# Patient Record
Sex: Female | Born: 1935 | Race: White | Hispanic: No | Marital: Married | Smoking: Never smoker
Health system: Southern US, Community
[De-identification: ages and names within clinical notes are randomized; demographics above are authoritative.]

## PROBLEM LIST (undated history)

## (undated) DIAGNOSIS — F0390 Unspecified dementia without behavioral disturbance: Secondary | ICD-10-CM

---

## 2012-01-05 DIAGNOSIS — I10 Essential (primary) hypertension: Secondary | ICD-10-CM | POA: Insufficient documentation

## 2013-01-18 DIAGNOSIS — K219 Gastro-esophageal reflux disease without esophagitis: Secondary | ICD-10-CM | POA: Insufficient documentation

## 2014-08-15 DIAGNOSIS — Z8639 Personal history of other endocrine, nutritional and metabolic disease: Secondary | ICD-10-CM | POA: Insufficient documentation

## 2019-03-26 DIAGNOSIS — E7800 Pure hypercholesterolemia, unspecified: Secondary | ICD-10-CM | POA: Insufficient documentation

## 2019-03-26 DIAGNOSIS — H409 Unspecified glaucoma: Secondary | ICD-10-CM | POA: Insufficient documentation

## 2019-06-07 DIAGNOSIS — F09 Unspecified mental disorder due to known physiological condition: Secondary | ICD-10-CM | POA: Insufficient documentation

## 2020-09-20 ENCOUNTER — Encounter: Payer: Self-pay | Admitting: Emergency Medicine

## 2020-09-20 ENCOUNTER — Emergency Department
Admission: EM | Admit: 2020-09-20 | Discharge: 2020-09-20 | Disposition: A | Discharge status: Home / Self Care | Payer: Self-pay | Source: Home / Self Care

## 2020-09-20 ENCOUNTER — Other Ambulatory Visit: Payer: Self-pay

## 2020-09-20 ENCOUNTER — Emergency Department (INDEPENDENT_AMBULATORY_CARE_PROVIDER_SITE_OTHER): Payer: Medicare Other

## 2020-09-20 DIAGNOSIS — J441 Chronic obstructive pulmonary disease with (acute) exacerbation: Secondary | ICD-10-CM | POA: Diagnosis not present

## 2020-09-20 DIAGNOSIS — J449 Chronic obstructive pulmonary disease, unspecified: Secondary | ICD-10-CM | POA: Diagnosis not present

## 2020-09-20 DIAGNOSIS — J069 Acute upper respiratory infection, unspecified: Secondary | ICD-10-CM | POA: Diagnosis not present

## 2020-09-20 HISTORY — DX: Unspecified dementia, unspecified severity, without behavioral disturbance, psychotic disturbance, mood disturbance, and anxiety: F03.90

## 2020-09-20 LAB — POC SARS CORONAVIRUS 2 AG -  ED: SARS Coronavirus 2 Ag: NEGATIVE

## 2020-09-20 MED ORDER — PREDNISONE 20 MG PO TABS: 20.0000 mg | ORAL_TABLET | Freq: Every day | ORAL | 0 refills | Status: AC

## 2020-09-20 MED ORDER — ALBUTEROL SULFATE HFA 108 (90 BASE) MCG/ACT IN AERS: 1.0000 | INHALATION_SPRAY | Freq: Four times a day (QID) | RESPIRATORY_TRACT | 0 refills | Status: AC | PRN

## 2020-09-20 NOTE — Discharge Instructions (Signed)
°  Follow up with primary care next week if symptoms not improving.   Call 911 or have someone drive you to the hospital if symptoms significantly worsening.

## 2020-09-20 NOTE — ED Triage Notes (Addendum)
Woke up this am w/ a sore throat & cough Pt concerned about bronchitis  Had J & J vaccine - had a booster, can not recall date Poor historian due to memory impairment  Husband waiting in car - did not want to come inside with pt per pt

## 2020-09-20 NOTE — ED Provider Notes (Signed)
[facility]    CSN: [id number] Arrival date & time: 09/20/20  1107      History   Chief Complaint Chief Complaint  Patient presents with  . Sore Throat  . Cough    HPI Kristina Fleming is a 85 y.o. female.   HPI Kristina Fleming is a 85 y.o. female presenting to UC with hx of dementia, some of the hx provided by husband over the phone, c/o sudden onset sore throat, cough and congestion this morning.  Pt concerned for bronchitis. Denies fever, n/v/d.  Denies hx of asthma. No medications taken PTA.  No known sick contacts. She had the J&J COVID Vaccine and a booster but unsure the date.     Past Medical History:  Diagnosis Date  . Dementia [facility])     Patient Active Problem List   Diagnosis Date Noted  . Cognitive dysfunction 06/07/2019  . Glaucoma 03/26/2019  . Hypercholesterolemia 03/26/2019  . History of vitamin D deficiency 08/15/2014  . GERD (gastroesophageal reflux disease) 01/18/2013  . Benign essential HTN 01/05/2012    History reviewed. No pertinent surgical history.  OB History   No obstetric history on file.      Home Medications    Prior to Admission medications   Medication Sig Start Date End Date Taking? Authorizing Provider  albuterol (VENTOLIN HFA) 108 (90 Base) MCG/ACT inhaler Inhale 1-2 puffs into the lungs every 6 (six) hours as needed for wheezing or shortness of breath. 09/20/20  Yes Khalilah Hoke O, PA-C  gabapentin (NEURONTIN) 300 MG capsule gabapentin 300 mg capsule  TAKE ONE CAPSULE BY MOUTH NIGHTLY 12/13/12  Yes [provider]  omeprazole (PRILOSEC) 20 MG capsule omeprazole 20 mg capsule,delayed release  TAKE ONE CAPSULE BY MOUTH EVERY DAY 12/30/18  Yes [provider]  predniSONE (DELTASONE) 20 MG tablet Take 1 tablet (20 mg total) by mouth daily with breakfast for 5 days. 09/20/20 09/25/20 Yes Devanee Pomplun O, PA-C  simvastatin (ZOCOR) 20 MG tablet simvastatin 20 mg tablet  TAKE ONE TABLET BY MOUTH EVERY DAY 11/27/18   Yes [provider]  triamterene-hydrochlorothiazide (MAXZIDE-25) 37.5-25 MG tablet triamterene 37.5 mg-hydrochlorothiazide 25 mg tablet  TAKE ONE TABLET BY MOUTH EVERY DAY 11/18/18  Yes [provider]  bimatoprost (LUMIGAN) 0.03 % ophthalmic solution Place 1 drop into both eyes at bedtime.    [provider]  Cholecalciferol 25 MCG (1000 UT) tablet Take by mouth.    [provider]    Family History Family History  Family history unknown: Yes    Social History Social History   Tobacco Use  . Smoking status: Never Smoker  . Smokeless tobacco: Never Used     Allergies   Alendronate sodium, Naproxen, and Codeine   Review of Systems Review of Systems  Constitutional: Negative for chills and fever.  HENT: Positive for congestion and sore throat. Negative for ear pain, trouble swallowing and voice change.   Respiratory: Positive for cough and chest tightness. Negative for shortness of breath.   Cardiovascular: Negative for chest pain and palpitations.  Gastrointestinal: Negative for abdominal pain, diarrhea, nausea and vomiting.  Musculoskeletal: Negative for arthralgias, back pain and myalgias.  Skin: Negative for rash.  Neurological: Negative for dizziness, light-headedness and headaches.  All other systems reviewed and are negative.    Physical Exam Triage Vital Signs ED Triage Vitals  Enc Vitals Group     BP 09/20/20 1132 (!) 161/89     Pulse Rate 09/20/20 1132 79  Resp 09/20/20 1132 18     Temp 09/20/20 1132 98.1 F (36.7 C)     Temp Source 09/20/20 1132 Oral     SpO2 09/20/20 1132 98 %     Weight 09/20/20 1142 145 lb (65.8 kg)     Height 09/20/20 1142 5\' 2"  (1.575 m)     Head Circumference --      Peak Flow --      Pain Score 09/20/20 1134 5     Pain Loc --      Pain Edu? --      Excl. in GC? --    No data found.  Updated Vital Signs BP (!) 161/89 (BP Location: Right Arm) Comment: did not take BP meds this am  Pulse  79   Temp 98.1 F (36.7 C) (Oral)   Resp 18   Ht 5\' 2"  (1.575 m)   Wt 145 lb (65.8 kg)   SpO2 98%   BMI 26.52 kg/m   Visual Acuity Right Eye Distance:   Left Eye Distance:   Bilateral Distance:    Right Eye Near:   Left Eye Near:    Bilateral Near:     Physical Exam Vitals and nursing note reviewed.  Constitutional:      General: She is not in acute distress.    Appearance: She is well-developed and well-nourished. She is not ill-appearing, toxic-appearing or diaphoretic.  HENT:     Head: Normocephalic and atraumatic.     Right Ear: Tympanic membrane and ear canal normal.     Left Ear: Tympanic membrane and ear canal normal.     Nose: Nose normal.     Right Sinus: No maxillary sinus tenderness or frontal sinus tenderness.     Left Sinus: No maxillary sinus tenderness or frontal sinus tenderness.     Mouth/Throat:     Lips: Pink.     Mouth: Mucous membranes are moist.     Pharynx: Oropharynx is clear. Uvula midline. No pharyngeal swelling, oropharyngeal exudate, posterior oropharyngeal erythema or uvula swelling.  Eyes:     Extraocular Movements: EOM normal.  Cardiovascular:     Rate and Rhythm: Normal rate and regular rhythm.  Pulmonary:     Effort: Pulmonary effort is normal. No respiratory distress.     Breath sounds: No stridor. Wheezing and rhonchi present. No rales.     Comments: Productive cough during exam. Faint diffuse wheeze and rhonchi. No respiratory distress.  Musculoskeletal:        General: Normal range of motion.     Cervical back: Normal range of motion and neck supple.  Lymphadenopathy:     Cervical: No cervical adenopathy.  Skin:    General: Skin is warm and dry.  Neurological:     Mental Status: She is alert and oriented to person, place, and time.  Psychiatric:        Mood and Affect: Mood and affect normal.        Behavior: Behavior normal.      UC Treatments / Results  Labs (all labs ordered are listed, but only abnormal results are  displayed) Labs Reviewed  POC SARS CORONAVIRUS 2 AG -  ED    EKG   Radiology DG Chest 2 View  Result Date: 09/20/2020 CLINICAL DATA:  Sore throat and cough beginning today. Concern for bronchitis. EXAM: CHEST - 2 VIEW COMPARISON:  None. FINDINGS: Heart size normal. Changes of COPD are noted. No edema or effusion is present. No focal airspace disease  is evident. Mild rightward curvature is present in the thoracic spine. IMPRESSION: 1. No acute cardiopulmonary disease. 2. COPD. Electronically Signed   By: Marin Roberts M.D.   On: 09/20/2020 12:12    Procedures Procedures (including critical care time)  Medications Ordered in UC Medications - No data to display  Initial Impression / Assessment and Plan / UC Course  I have reviewed the triage vital signs and the nursing notes.  Pertinent labs & imaging results that were available during my care of the patient were reviewed by me and considered in my medical decision making (see chart for details).     Rapid COVID: NEGATIVE Discussed imaging with pt Will tx for COPD exacerbation Pt escorted out to car where husband was waiting, AVS also discussed with husband.  F/u with PCP Discussed symptoms that warrant emergent care in the ED.  Final Clinical Impressions(s) / UC Diagnoses   Final diagnoses:  Acute upper respiratory infection  COPD exacerbation (HCC)     Discharge Instructions      Follow up with primary care next week if symptoms not improving.   Call 911 or have someone drive you to the hospital if symptoms significantly worsening.     ED Prescriptions    Medication Sig Dispense Auth. Provider   albuterol (VENTOLIN HFA) 108 (90 Base) MCG/ACT inhaler Inhale 1-2 puffs into the lungs every 6 (six) hours as needed for wheezing or shortness of breath. 1 each Lurene Shadow, PA-C   predniSONE (DELTASONE) 20 MG tablet Take 1 tablet (20 mg total) by mouth daily with breakfast for 5 days. 5 tablet Lurene Shadow,  PA-C     PDMP not reviewed this encounter.   Lurene Shadow, [place] 09/20/20 1233

## 2022-10-01 IMAGING — DX DG CHEST 2V
2 series · 2 of 2 positions shown · non-contrast
Comparison: None.

CLINICAL DATA: Sore throat and cough beginning today. Concern for
bronchitis.

EXAM:
CHEST - 2 VIEW

[chest pa]
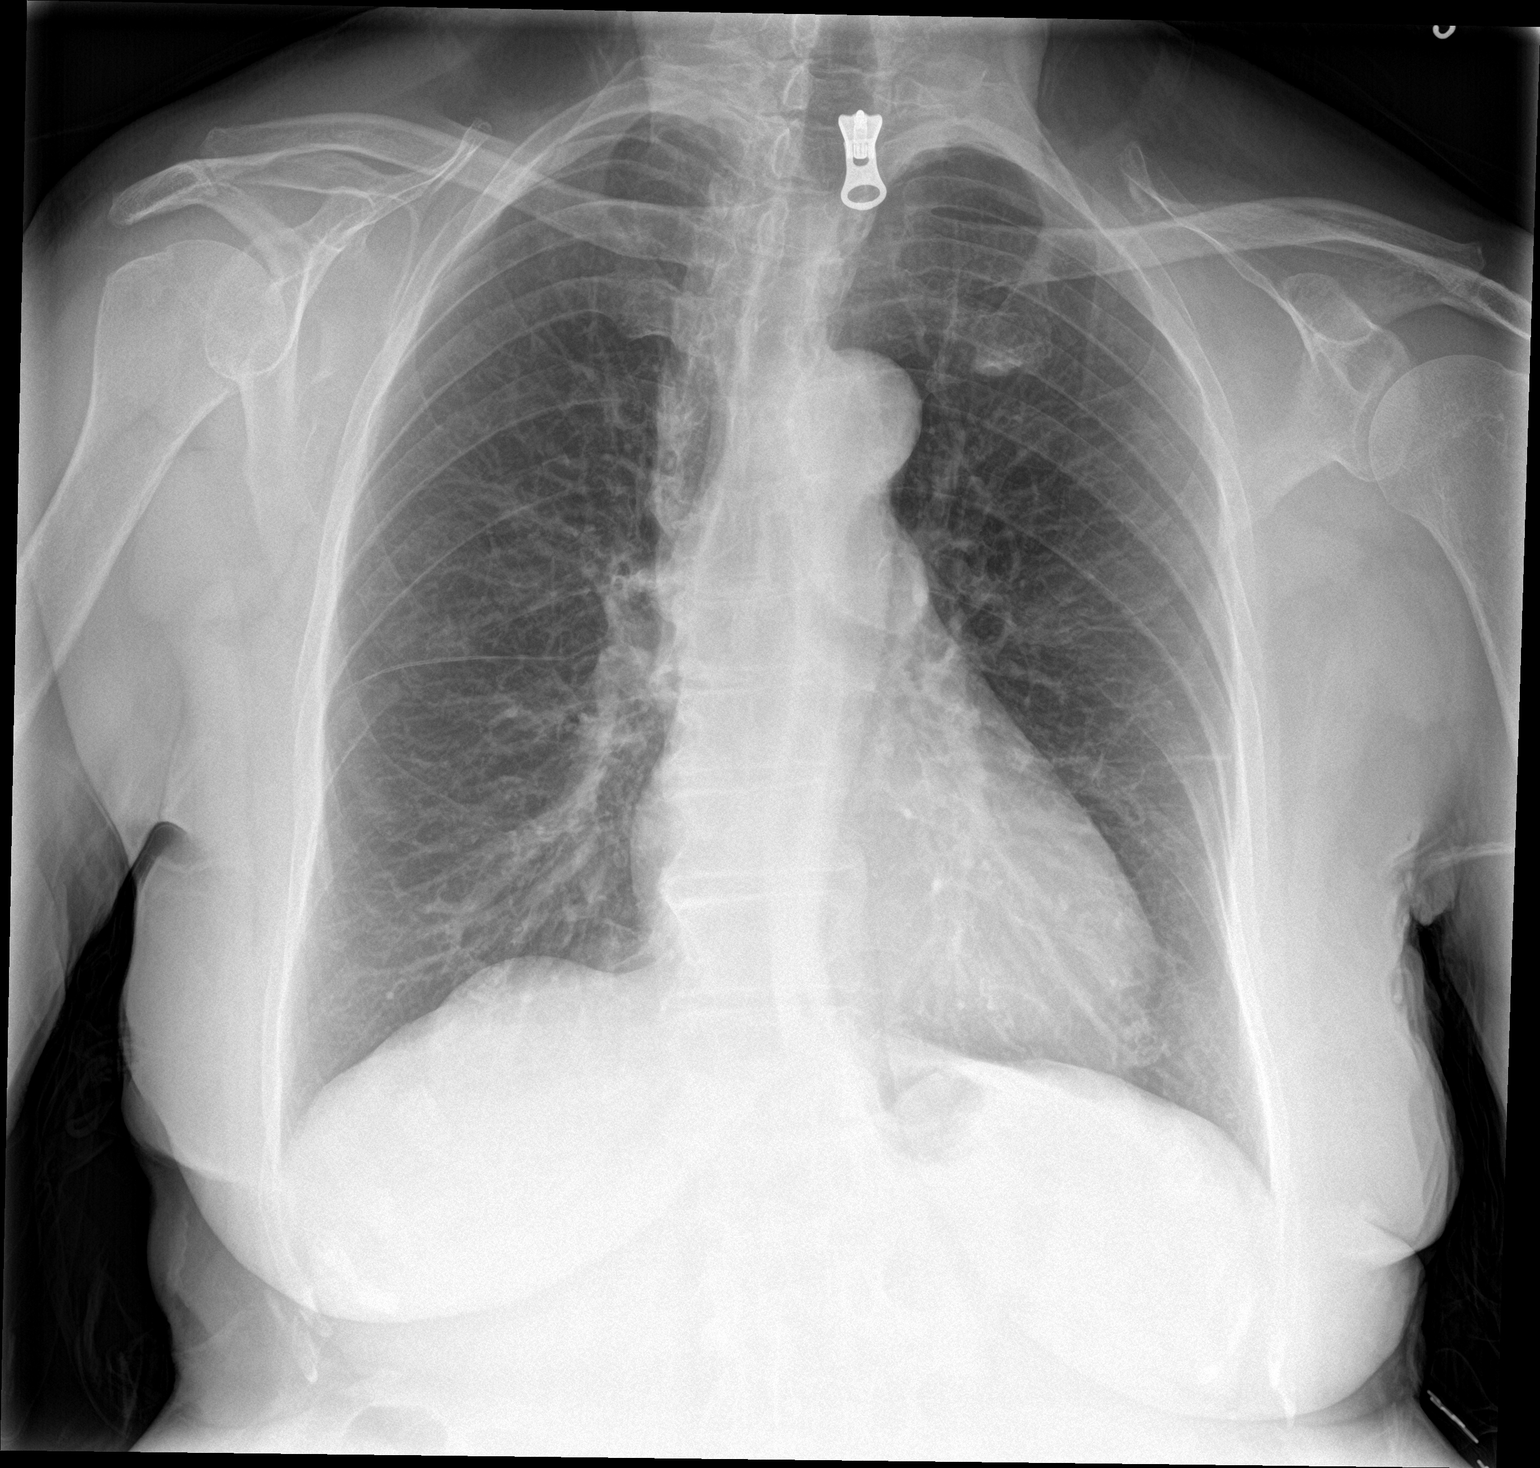

[chest lat]
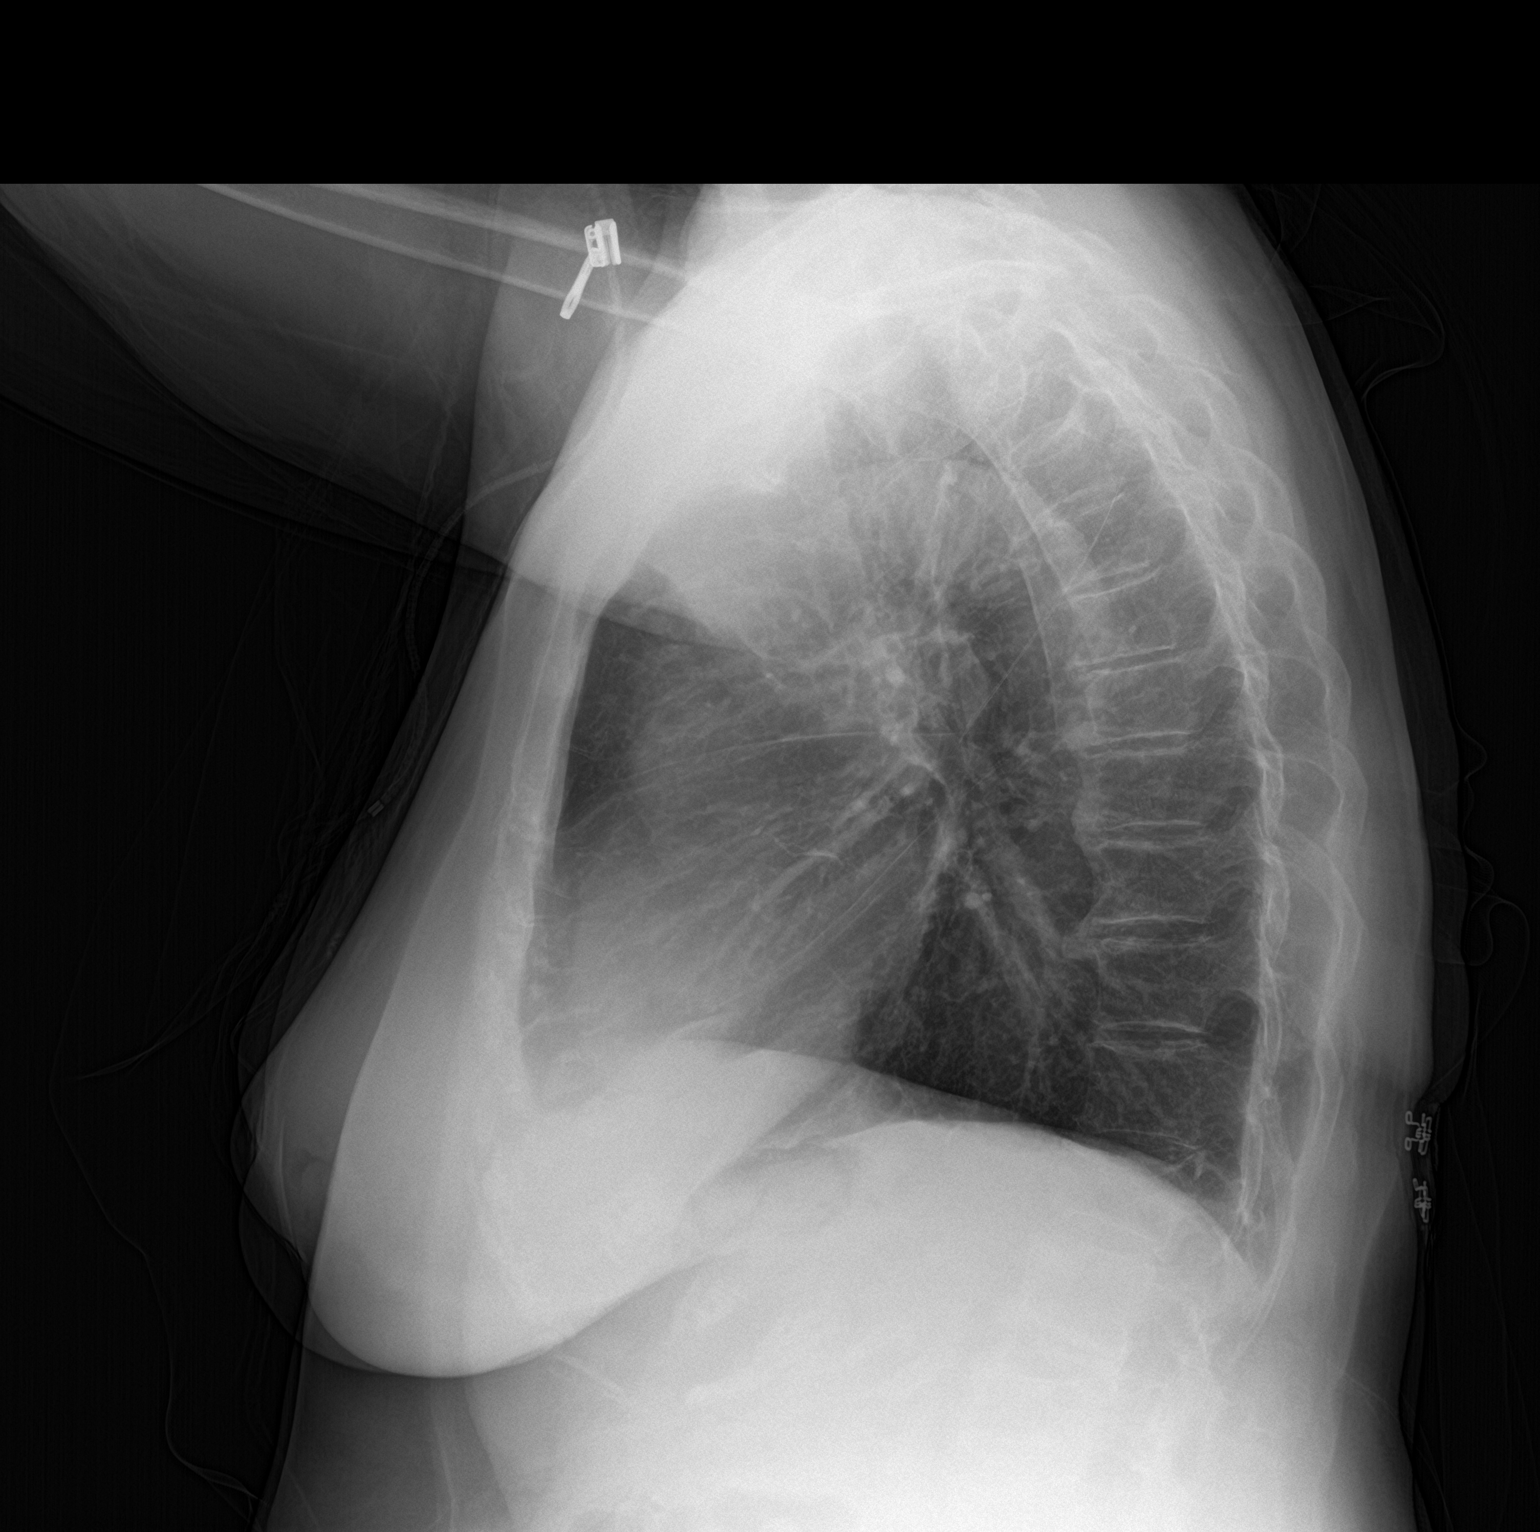

[2 of 2 positions shown; findings below may reference images not displayed]

FINDINGS: Heart size normal. Changes of COPD are noted. No edema or effusion
is present. No focal airspace disease is evident. Mild rightward
curvature is present in the thoracic spine.
IMPRESSION: 1. No acute cardiopulmonary disease.
2. COPD.
